# Patient Record
Sex: Female | Born: 1995 | Race: White | Hispanic: No | Marital: Single | Smoking: Never smoker
Health system: Southern US, Community
[De-identification: ages and names within clinical notes are randomized; demographics above are authoritative.]

## PROBLEM LIST (undated history)

## (undated) DIAGNOSIS — N946 Dysmenorrhea, unspecified: Secondary | ICD-10-CM

## (undated) DIAGNOSIS — H539 Unspecified visual disturbance: Secondary | ICD-10-CM

## (undated) DIAGNOSIS — K219 Gastro-esophageal reflux disease without esophagitis: Secondary | ICD-10-CM

## (undated) DIAGNOSIS — J301 Allergic rhinitis due to pollen: Secondary | ICD-10-CM

## (undated) HISTORY — DX: Unspecified visual disturbance: H53.9

## (undated) HISTORY — DX: Gastro-esophageal reflux disease without esophagitis: K21.9

## (undated) HISTORY — DX: Allergic rhinitis due to pollen: J30.1

## (undated) HISTORY — DX: Dysmenorrhea, unspecified: N94.6

## (undated) HISTORY — PX: NO PAST SURGERIES: SHX2092

---
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
## 2017-06-24 DIAGNOSIS — F3341 Major depressive disorder, recurrent, in partial remission: Secondary | ICD-10-CM | POA: Insufficient documentation

## 2017-06-24 DIAGNOSIS — J4540 Moderate persistent asthma, uncomplicated: Secondary | ICD-10-CM | POA: Insufficient documentation

## 2019-02-23 DIAGNOSIS — F988 Other specified behavioral and emotional disorders with onset usually occurring in childhood and adolescence: Secondary | ICD-10-CM | POA: Insufficient documentation

## 2019-02-23 DIAGNOSIS — Z720 Tobacco use: Secondary | ICD-10-CM | POA: Insufficient documentation

## 2019-02-23 DIAGNOSIS — F411 Generalized anxiety disorder: Secondary | ICD-10-CM | POA: Insufficient documentation

## 2020-01-09 NOTE — L&D Delivery Note (Addendum)
°  Delivery Note:   G1P0 at [redacted]w[redacted]d  Admitting diagnosis: Indication for care in labor or delivery [O75.9] Risks: Rh negative MJ use  Onset of labor: 2055 IOL/Augmentation: AROM and Pitocin ROM: 2245  Complete dilation at 12/31/2020  0056 Onset of pushing at 0104 FHR second stage 145  Analgesia /Anesthesia intrapartum:Epidural  Pushing in lithotomy position with CNM and L&D staff support, mother present for birth and supportive.  Delivery of a Live born female  Birth Weight:  3210g APGAR: 9, 9  Newborn Delivery   Birth date/time: 12/31/2020 01:44:00 Delivery type: Vaginal, Spontaneous      in cephalic presentation, position ROA. Gentle traction applied and shoulders delivered with ease. Baby delivered and placed onto mom's chest. Baby crying and mother excited.  APGAR:1 min-9 , 5 min-9   Nuchal Cord: Yes , reduced  Cord double clamped after cessation of pulsation, cut by mother.  Collection of cord blood for typing completed. Cord blood donation-None  Arterial cord blood sample-No    Placenta delivered-Spontaneous  with 3 vessels . Uterotonics: IV Pitocin Placenta to L&D Uterine tone firm  Bleeding mild  1st degree;Vaginal  laceration identified.  Episiotomy:None  Local analgesia: N/A  Repair: 3.0 suture, well approximated, no hematoma Est. Blood Loss (mL):897.00   Complications: None  Mom to postpartum.  Baby Reagan to Couplet care / Skin to Skin.  Delivery Report:   Review the Delivery Report for details.    Trellis Moment, SNM 12/31/2020, 3:59 AM   The above was performed under my direct supervision and guidance.

## 2020-05-10 LAB — OB RESULTS CONSOLE GC/CHLAMYDIA
Chlamydia: NEGATIVE
Gonorrhea: NEGATIVE

## 2020-05-10 LAB — OB RESULTS CONSOLE RPR: RPR: NONREACTIVE

## 2020-05-10 LAB — HEPATITIS C ANTIBODY: HCV Ab: NEGATIVE

## 2020-05-10 LAB — OB RESULTS CONSOLE HEPATITIS B SURFACE ANTIGEN: Hepatitis B Surface Ag: NEGATIVE

## 2020-05-10 LAB — OB RESULTS CONSOLE GBS: GBS: NEGATIVE

## 2020-05-10 LAB — OB RESULTS CONSOLE RUBELLA ANTIBODY, IGM: Rubella: IMMUNE

## 2020-05-10 LAB — OB RESULTS CONSOLE HIV ANTIBODY (ROUTINE TESTING): HIV: NONREACTIVE

## 2020-05-17 DIAGNOSIS — R87612 Low grade squamous intraepithelial lesion on cytologic smear of cervix (LGSIL): Secondary | ICD-10-CM | POA: Insufficient documentation

## 2020-12-30 ENCOUNTER — Other Ambulatory Visit: Payer: Self-pay

## 2020-12-30 ENCOUNTER — Inpatient Hospital Stay (HOSPITAL_COMMUNITY)
Admission: AD | Admit: 2020-12-30 | Discharge: 2021-01-01 | DRG: 806 | Disposition: A | Discharge status: Home / Self Care | Payer: BLUE CROSS/BLUE SHIELD | Attending: Obstetrics and Gynecology | Admitting: Obstetrics and Gynecology

## 2020-12-30 ENCOUNTER — Inpatient Hospital Stay (HOSPITAL_COMMUNITY): Payer: BLUE CROSS/BLUE SHIELD | Admitting: Anesthesiology

## 2020-12-30 ENCOUNTER — Encounter (HOSPITAL_COMMUNITY): Payer: Self-pay | Admitting: Obstetrics and Gynecology

## 2020-12-30 DIAGNOSIS — F1290 Cannabis use, unspecified, uncomplicated: Secondary | ICD-10-CM | POA: Diagnosis present

## 2020-12-30 DIAGNOSIS — O480 Post-term pregnancy: Principal | ICD-10-CM | POA: Diagnosis present

## 2020-12-30 DIAGNOSIS — O99324 Drug use complicating childbirth: Secondary | ICD-10-CM | POA: Diagnosis present

## 2020-12-30 DIAGNOSIS — O09899 Supervision of other high risk pregnancies, unspecified trimester: Secondary | ICD-10-CM

## 2020-12-30 DIAGNOSIS — O26893 Other specified pregnancy related conditions, third trimester: Secondary | ICD-10-CM | POA: Diagnosis present

## 2020-12-30 DIAGNOSIS — O4202 Full-term premature rupture of membranes, onset of labor within 24 hours of rupture: Secondary | ICD-10-CM | POA: Diagnosis not present

## 2020-12-30 DIAGNOSIS — Z20822 Contact with and (suspected) exposure to covid-19: Secondary | ICD-10-CM | POA: Diagnosis present

## 2020-12-30 DIAGNOSIS — J45909 Unspecified asthma, uncomplicated: Secondary | ICD-10-CM | POA: Diagnosis present

## 2020-12-30 DIAGNOSIS — Z3A40 40 weeks gestation of pregnancy: Secondary | ICD-10-CM | POA: Diagnosis not present

## 2020-12-30 DIAGNOSIS — O9952 Diseases of the respiratory system complicating childbirth: Secondary | ICD-10-CM | POA: Diagnosis present

## 2020-12-30 DIAGNOSIS — Z2839 Other underimmunization status: Secondary | ICD-10-CM

## 2020-12-30 DIAGNOSIS — Z6791 Unspecified blood type, Rh negative: Secondary | ICD-10-CM | POA: Diagnosis not present

## 2020-12-30 LAB — POCT FERN TEST: POCT Fern Test: POSITIVE

## 2020-12-30 LAB — CBC
HCT: 37.6 % (ref 36.0–46.0)
Hemoglobin: 12.5 g/dL (ref 12.0–15.0)
MCH: 31.6 pg (ref 26.0–34.0)
MCHC: 33.2 g/dL (ref 30.0–36.0)
MCV: 95.2 fL (ref 80.0–100.0)
Platelets: 176 10*3/uL (ref 150–400)
RBC: 3.95 MIL/uL (ref 3.87–5.11)
RDW: 13.6 % (ref 11.5–15.5)
WBC: 8 10*3/uL (ref 4.0–10.5)
nRBC: 0 % (ref 0.0–0.2)

## 2020-12-30 LAB — RESP PANEL BY RT-PCR (FLU A&B, COVID) ARPGX2
Influenza A by PCR: NEGATIVE
Influenza B by PCR: NEGATIVE
SARS Coronavirus 2 by RT PCR: NEGATIVE

## 2020-12-30 LAB — TYPE AND SCREEN
ABO/RH(D): O NEG
Antibody Screen: POSITIVE

## 2020-12-30 MED ORDER — ACETAMINOPHEN 325 MG PO TABS: 650.0000 mg | ORAL_TABLET | ORAL | Status: DC | PRN

## 2020-12-30 MED ORDER — OXYTOCIN BOLUS FROM INFUSION
333.0000 mL | Freq: Once | INTRAVENOUS | Status: AC
  Administered 2020-12-31: 02:00:00 333 mL via INTRAVENOUS

## 2020-12-30 MED ORDER — OXYTOCIN-SODIUM CHLORIDE 30-0.9 UT/500ML-% IV SOLN
1.0000 m[IU]/min | INTRAVENOUS | Status: DC
  Administered 2020-12-30: 23:00:00 4 m[IU]/min via INTRAVENOUS
  Administered 2020-12-30: 23:00:00 2 m[IU]/min via INTRAVENOUS

## 2020-12-30 MED ORDER — FENTANYL CITRATE (PF) 100 MCG/2ML IJ SOLN: 50.0000 ug | INTRAMUSCULAR | Status: DC | PRN

## 2020-12-30 MED ORDER — ONDANSETRON HCL 4 MG/2ML IJ SOLN: 4.0000 mg | Freq: Four times a day (QID) | INTRAMUSCULAR | Status: DC | PRN

## 2020-12-30 MED ORDER — LIDOCAINE HCL (PF) 1 % IJ SOLN
INTRAMUSCULAR | Status: DC | PRN
  Administered 2020-12-30: 8 mL via EPIDURAL

## 2020-12-30 MED ORDER — OXYTOCIN-SODIUM CHLORIDE 30-0.9 UT/500ML-% IV SOLN
2.5000 [IU]/h | INTRAVENOUS | Status: DC
  Administered 2020-12-31: 02:00:00 2.5 [IU]/h via INTRAVENOUS
  Filled 2020-12-30: qty 500

## 2020-12-30 MED ORDER — LACTATED RINGERS IV SOLN: INTRAVENOUS | Status: DC

## 2020-12-30 MED ORDER — LIDOCAINE HCL (PF) 1 % IJ SOLN: 30.0000 mL | INTRAMUSCULAR | Status: DC | PRN

## 2020-12-30 MED ORDER — DIPHENHYDRAMINE HCL 50 MG/ML IJ SOLN: 12.5000 mg | INTRAMUSCULAR | Status: DC | PRN

## 2020-12-30 MED ORDER — TERBUTALINE SULFATE 1 MG/ML IJ SOLN: 0.2500 mg | Freq: Once | INTRAMUSCULAR | Status: DC | PRN

## 2020-12-30 MED ORDER — LACTATED RINGERS IV SOLN
500.0000 mL | INTRAVENOUS | Status: DC | PRN
  Administered 2020-12-31: 03:00:00 500 mL via INTRAVENOUS

## 2020-12-30 MED ORDER — LACTATED RINGERS IV SOLN
500.0000 mL | Freq: Once | INTRAVENOUS | Status: AC
  Administered 2020-12-30: 18:00:00 500 mL via INTRAVENOUS

## 2020-12-30 MED ORDER — EPHEDRINE 5 MG/ML INJ: 10.0000 mg | INTRAVENOUS | Status: DC | PRN

## 2020-12-30 MED ORDER — FENTANYL-BUPIVACAINE-NACL 0.5-0.125-0.9 MG/250ML-% EP SOLN
12.0000 mL/h | EPIDURAL | Status: DC | PRN
  Administered 2020-12-30: 17:00:00 12 mL/h via EPIDURAL
  Filled 2020-12-30: qty 250

## 2020-12-30 MED ORDER — FENTANYL-BUPIVACAINE-NACL 0.5-0.125-0.9 MG/250ML-% EP SOLN: 12.0000 mL/h | EPIDURAL | Status: DC | PRN

## 2020-12-30 MED ORDER — OXYCODONE-ACETAMINOPHEN 5-325 MG PO TABS: 2.0000 | ORAL_TABLET | ORAL | Status: DC | PRN

## 2020-12-30 MED ORDER — PHENYLEPHRINE 40 MCG/ML (10ML) SYRINGE FOR IV PUSH (FOR BLOOD PRESSURE SUPPORT)
80.0000 ug | PREFILLED_SYRINGE | INTRAVENOUS | Status: DC | PRN
  Administered 2020-12-31: 04:00:00 80 ug via INTRAVENOUS

## 2020-12-30 MED ORDER — PHENYLEPHRINE 40 MCG/ML (10ML) SYRINGE FOR IV PUSH (FOR BLOOD PRESSURE SUPPORT)
80.0000 ug | PREFILLED_SYRINGE | INTRAVENOUS | Status: DC | PRN
  Filled 2020-12-30: qty 10

## 2020-12-30 MED ORDER — SOD CITRATE-CITRIC ACID 500-334 MG/5ML PO SOLN
30.0000 mL | ORAL | Status: DC | PRN
  Administered 2020-12-30: 21:00:00 30 mL via ORAL
  Filled 2020-12-30: qty 30

## 2020-12-30 MED ORDER — OXYCODONE-ACETAMINOPHEN 5-325 MG PO TABS: 1.0000 | ORAL_TABLET | ORAL | Status: DC | PRN

## 2020-12-30 NOTE — Anesthesia Procedure Notes (Signed)
Epidural Patient location during procedure: OB Start time: 12/30/2020 5:14 PM End time: 12/30/2020 5:12 PM  Staffing Anesthesiologist: Bethena Midget, MD  Preanesthetic Checklist Completed: patient identified, IV checked, site marked, risks and benefits discussed, surgical consent, monitors and equipment checked, pre-op evaluation and timeout performed  Epidural Patient position: sitting Prep: DuraPrep and site prepped and draped Patient monitoring: continuous pulse ox and blood pressure Approach: midline Location: L4-L5 Injection technique: LOR air  Needle:  Needle type: Tuohy  Needle gauge: 17 G Needle length: 9 cm and 9 Needle insertion depth: 7 cm Catheter type: closed end flexible Catheter size: 19 Gauge Catheter at skin depth: 12 cm Test dose: negative  Assessment Events: blood not aspirated, injection not painful, no injection resistance, no paresthesia and negative IV test

## 2020-12-30 NOTE — MAU Note (Signed)
...  Tonya Montes is a 25 y.o. at [redacted]w[redacted]d here in MAU reporting: CTX every 8 minutes since yesterday evening and LOF since midnight. She states she was seen in L&D Triage in [facility] yesterday evening and was sent home for false labor. She states she was supposed to be an induction today but that she was told multiple times last night that they were short staffed and would most likely not be able to be brought in the next day. She states she received a call from a nurse this morning that once again stated they were busy and short staffed and that if she was having CTX to call her OB and schedule an appointment to be seen. She states she has also had light brown vaginal bleeding.  She states around midnight she had a trickle of fluids mixed with blood run down her leg and she has had a couple of episodes of leaking since this occurred.   DFM since this morning. Feeling movement just states it is less than normal.  OB/GYN in [place]. Last [facility] visit yesterday.  Pain score:  6/10 lower back - with contractions  FHT: 150 initial external

## 2020-12-30 NOTE — Anesthesia Preprocedure Evaluation (Signed)
Anesthesia Evaluation  Patient identified by MRN, date of birth, ID band Patient awake    Reviewed: Allergy & Precautions, H&P , NPO status , Patient's Chart, lab work & pertinent test results, reviewed documented beta blocker date and time   Airway Mallampati: II  TM Distance: >3 FB Neck ROM: full    Dental no notable dental hx. (+) Teeth Intact, Dental Advisory Given   Pulmonary asthma ,    Pulmonary exam normal breath sounds clear to auscultation       Cardiovascular negative cardio ROS Normal cardiovascular exam Rhythm:regular Rate:Normal     Neuro/Psych negative neurological ROS  negative psych ROS   GI/Hepatic negative GI ROS, Neg liver ROS,   Endo/Other  negative endocrine ROS  Renal/GU negative Renal ROS  negative genitourinary   Musculoskeletal   Abdominal   Peds  Hematology negative hematology ROS (+)   Anesthesia Other Findings   Reproductive/Obstetrics (+) Pregnancy                             Anesthesia Physical Anesthesia Plan  ASA: 2  Anesthesia Plan: Epidural   Post-op Pain Management:    Induction:   PONV Risk Score and Plan: 2 and Ondansetron and Treatment may vary due to age or medical condition  Airway Management Planned: Natural Airway  Additional Equipment: None  Intra-op Plan:   Post-operative Plan:   Informed Consent: I have reviewed the patients History and Physical, chart, labs and discussed the procedure including the risks, benefits and alternatives for the proposed anesthesia with the patient or authorized representative who has indicated his/her understanding and acceptance.       Plan Discussed with: Anesthesiologist  Anesthesia Plan Comments:         Anesthesia Quick Evaluation
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
## 2020-12-30 NOTE — Progress Notes (Signed)
S: Patient overall comfortable with epidural. Mother at bedside. No concerns at this time. Anticipating the arrival of abby girl Reagan.   O: Vitals:   12/30/20 1945 12/30/20 2100 12/30/20 2130 12/30/20 2200  BP: 116/65 114/78 109/61 (!) 104/53  Pulse: 85 (!) 167 87 89  Resp: 18 18 18 18   Temp:      TempSrc:      SpO2:      Weight:      Height:        FHT:  FHR: 125 bpm, variability: moderate,  accelerations:  Present,  decelerations:  Absent UC:   irregular, every 2-6 minutes SVE:   Dilation: 6.5 Effacement (%): 80 Station: -1 Exam by:: [email or web address], SNM  A / P: 25 y.o. G1P0 [redacted]w[redacted]d SROM at 0000, protracted active labor  AROM 2245 Pitocin for labor augmentation due to unchanged cervix GBS negative  Fetal Wellbeing:  Category I Pain Control:  Epidural Anticipated MOD:  NSVD  [redacted]w[redacted]d, SNM 12/30/2020, 10:49 PM

## 2020-12-31 ENCOUNTER — Encounter (HOSPITAL_COMMUNITY): Payer: Self-pay | Admitting: Obstetrics and Gynecology

## 2020-12-31 DIAGNOSIS — O4202 Full-term premature rupture of membranes, onset of labor within 24 hours of rupture: Secondary | ICD-10-CM

## 2020-12-31 DIAGNOSIS — O99324 Drug use complicating childbirth: Secondary | ICD-10-CM

## 2020-12-31 DIAGNOSIS — O480 Post-term pregnancy: Secondary | ICD-10-CM

## 2020-12-31 DIAGNOSIS — Z3A40 40 weeks gestation of pregnancy: Secondary | ICD-10-CM

## 2020-12-31 LAB — RPR: RPR Ser Ql: NONREACTIVE

## 2020-12-31 MED ORDER — ACETAMINOPHEN 325 MG PO TABS
650.0000 mg | ORAL_TABLET | ORAL | Status: DC | PRN
  Administered 2020-12-31 (×2): 650 mg via ORAL
  Filled 2020-12-31 (×2): qty 2

## 2020-12-31 MED ORDER — ONDANSETRON HCL 4 MG/2ML IJ SOLN: 4.0000 mg | INTRAMUSCULAR | Status: DC | PRN

## 2020-12-31 MED ORDER — ONDANSETRON HCL 4 MG PO TABS: 4.0000 mg | ORAL_TABLET | ORAL | Status: DC | PRN

## 2020-12-31 MED ORDER — COCONUT OIL OIL: 1.0000 "application " | TOPICAL_OIL | Status: DC | PRN

## 2020-12-31 MED ORDER — SENNOSIDES-DOCUSATE SODIUM 8.6-50 MG PO TABS
2.0000 | ORAL_TABLET | Freq: Every day | ORAL | Status: DC
  Administered 2021-01-01: 10:00:00 2 via ORAL
  Filled 2020-12-31: qty 2

## 2020-12-31 MED ORDER — WITCH HAZEL-GLYCERIN EX PADS: 1.0000 "application " | MEDICATED_PAD | CUTANEOUS | Status: DC | PRN

## 2020-12-31 MED ORDER — RHO D IMMUNE GLOBULIN 1500 UNIT/2ML IJ SOSY
300.0000 ug | PREFILLED_SYRINGE | Freq: Once | INTRAMUSCULAR | Status: AC
  Administered 2020-12-31: 12:00:00 300 ug via INTRAVENOUS
  Filled 2020-12-31: qty 2

## 2020-12-31 MED ORDER — TETANUS-DIPHTH-ACELL PERTUSSIS 5-2.5-18.5 LF-MCG/0.5 IM SUSY: 0.5000 mL | PREFILLED_SYRINGE | Freq: Once | INTRAMUSCULAR | Status: DC

## 2020-12-31 MED ORDER — DIPHENHYDRAMINE HCL 25 MG PO CAPS: 25.0000 mg | ORAL_CAPSULE | Freq: Four times a day (QID) | ORAL | Status: DC | PRN

## 2020-12-31 MED ORDER — DIBUCAINE (PERIANAL) 1 % EX OINT: 1.0000 "application " | TOPICAL_OINTMENT | CUTANEOUS | Status: DC | PRN

## 2020-12-31 MED ORDER — IBUPROFEN 600 MG PO TABS
600.0000 mg | ORAL_TABLET | Freq: Four times a day (QID) | ORAL | Status: DC
  Administered 2020-12-31 – 2021-01-01 (×5): 600 mg via ORAL
  Filled 2020-12-31 (×7): qty 1

## 2020-12-31 MED ORDER — OXYCODONE HCL 5 MG PO TABS: 5.0000 mg | ORAL_TABLET | ORAL | Status: DC | PRN

## 2020-12-31 MED ORDER — PRENATAL MULTIVITAMIN CH
1.0000 | ORAL_TABLET | Freq: Every day | ORAL | Status: DC
  Administered 2020-12-31 – 2021-01-01 (×2): 1 via ORAL
  Filled 2020-12-31 (×2): qty 1

## 2020-12-31 MED ORDER — ZOLPIDEM TARTRATE 5 MG PO TABS: 5.0000 mg | ORAL_TABLET | Freq: Every evening | ORAL | Status: DC | PRN

## 2020-12-31 MED ORDER — BENZOCAINE-MENTHOL 20-0.5 % EX AERO
1.0000 "application " | INHALATION_SPRAY | CUTANEOUS | Status: DC | PRN
  Administered 2020-12-31: 1 via TOPICAL
  Filled 2020-12-31: qty 56

## 2020-12-31 MED ORDER — SIMETHICONE 80 MG PO CHEW: 80.0000 mg | CHEWABLE_TABLET | ORAL | Status: DC | PRN

## 2020-12-31 NOTE — Discharge Summary (Signed)
Postpartum Discharge Summary  Date of Service updated     Patient Name: Tonya Montes DOB: 1995-02-23 MRN: [id number]  Date of admission: 12/30/2020 Delivery date:12/31/2020  Delivering provider: WHITE, Ubaldo Glassing D  Date of discharge: 01/01/2021  Admitting diagnosis: Indication for care in labor or delivery [O75.9] Intrauterine pregnancy: [redacted]w[redacted]d    Secondary diagnosis:  Principal Problem:   Indication for care in labor or delivery Active Problems:   Rubella non-immune status, antepartum  Additional problems: Rh neg    Discharge diagnosis: Term Pregnancy Delivered                                              Post partum procedures: RhoGAM  Augmentation: Pitocin Complications: None  Hospital course: Onset of Labor With Vaginal Delivery      25y.o. yo G1P0 at 412w5das admitted in Active Labor on 12/30/2020. Patient had an uncomplicated labor course as follows:  Membrane Rupture Time/Date: 12:00 AM ,12/30/2020   Delivery Method:Vaginal, Spontaneous  Episiotomy: None  Lacerations:  1st degree;Vaginal  Patient had an uncomplicated postpartum course.  She is ambulating, tolerating a regular diet, passing flatus, and urinating well. Patient is discharged home in stable condition on 01/01/21.  Newborn Data: Birth date:12/31/2020  Birth time:1:44 AM  Gender:Female  Living status:Living  Apgars:9 ,9  Weight:3210 g   Magnesium Sulfate received: No BMZ received: No Rhophylac:Yes MMR: offered T-DaP:Given prenatally Flu: No Transfusion:No  Physical exam  Vitals:   12/31/20 0920 12/31/20 1320 12/31/20 2112 01/01/21 0522  BP: 104/65 (!) 98/58 121/70 (!) 100/58  Pulse: 88 82 77 81  Resp: '18 18 18 18  ' Temp: 98 F (36.7 C) 99 F (37.2 C) 98.4 F (36.9 C) 98.1 F (36.7 C)  TempSrc: Oral Oral Oral Oral  SpO2:   100% 100%  Weight:      Height:       General: alert, cooperative, and no distress Lochia: appropriate Uterine Fundus: firm Incision: N/A DVT  Evaluation: No significant calf/ankle edema. Labs: Lab Results  Component Value Date   WBC 8.0 12/30/2020   HGB 12.5 12/30/2020   HCT 37.6 12/30/2020   MCV 95.2 12/30/2020   PLT 176 12/30/2020   No flowsheet data found. Edinburgh Score: Edinburgh Postnatal Depression Scale Screening Tool 01/01/2021  I have been able to laugh and see the funny side of things. 0  I have looked forward with enjoyment to things. 0  I have blamed myself unnecessarily when things went wrong. 1  I have been anxious or worried for no good reason. 1  I have felt scared or panicky for no good reason. 0  Things have been getting on top of me. 0  I have been so unhappy that I have had difficulty sleeping. 0  I have felt sad or miserable. 0  I have been so unhappy that I have been crying. 0  The thought of harming myself has occurred to me. 0  Edinburgh Postnatal Depression Scale Total 2     After visit meds:  Allergies as of 01/01/2021   No Known Allergies      Medication List     TAKE these medications    acetaminophen 500 MG tablet Commonly known as: TYLENOL Take 500-1,000 mg by mouth every 6 (six) hours as needed for mild pain or headache.   albuterol 108 (90  Base) MCG/ACT inhaler Commonly known as: VENTOLIN HFA Inhale 2 puffs into the lungs every 4 (four) hours as needed (for cough, chest tightness). What changed: reasons to take this   famotidine 20 MG tablet Commonly known as: PEPCID Take 20 mg by mouth daily.   ibuprofen 600 MG tablet Commonly known as: ADVIL Take 1 tablet (600 mg total) by mouth every 6 (six) hours as needed.   multivitamin-prenatal 27-0.8 MG Tabs tablet Take 1 tablet by mouth daily at 12 noon.         Discharge home in stable condition Infant Feeding: Bottle Infant Disposition:home with mother Discharge instruction: per After Visit Summary and Postpartum booklet. Activity: Advance as tolerated. Pelvic rest for 6 weeks.  Diet: routine diet Future  Appointments:No future appointments. Follow up Visit:  Instructed to call [facility] for postpartum care as well.  Please schedule this patient for a In person postpartum visit in 4 weeks with the following provider: Any provider. Additional Postpartum F/U:  Low risk pregnancy complicated by:  Delivery mode:  Vaginal, Spontaneous  Anticipated Birth Control:   declined   01/01/2021 Patriciaann Clan, DO

## 2020-12-31 NOTE — Clinical Social Work Maternal (Signed)
CLINICAL SOCIAL WORK MATERNAL/CHILD NOTE  Patient Details  Name: Tonya Montes MRN: [id number] Date of Birth: October 05, 1995  Date:  12/31/2020  Clinical Social Worker Initiating Note:  Darcus Austin, MSW, LCSWA Date/Time: Initiated:  12/31/20/1640     Child's Name:  Tonya Montes   Biological Parents:  Mother   Need for Interpreter:  None   Reason for Referral:  Behavioral Health Concerns, Current Substance Use/Substance Use During Pregnancy     Address: [address], [place] [address]  Janalyn Rouse [facility] Address:   [place] [address]    Phone number:  [phone] (home)     Additional phone number: [phone] [place] Members/Support Persons (HM/SP):   Household Member/Support Person 1   HM/SP Name Relationship DOB or Age  HM/SP -[place]    HM/SP -2        HM/SP -3        HM/SP -4        HM/SP -5        HM/SP -6        HM/SP -7        HM/SP -8          Natural Supports (not living in the home):  Immediate Family, Extended Family   Professional Supports: None   Employment: Part-time   Type of Work: Tour manager:  [place] arranged:    Pensions consultant:  Multimedia programmer    Other Resources:  [employer], Physicist, medical     Cultural/Religious Considerations Which May Impact Care:    Strengths:  Ability to meet basic needs  , Engineer, materials, Home prepared for child     Psychotropic Medications:         Pediatrician:    Careers adviser area  Pediatrician List:   [facility] ([address])  [place]      Pediatrician Fax Number:    Risk Factors/Current Problems:  Substance Use  , Mental Health Concerns     Cognitive State:  Able to Concentrate  , Alert  , Goal Oriented  , Insightful  , Linear Thinking     Mood/Affect:  Comfortable  ,  Interested  , Calm  , Happy  , Bright  , Relaxed     CSW Assessment: CSW met with MOB to complete consult for mental health, and substance use during pregnancy. CSW observed MOB sitting in recliner, and infant in bassinet. CSW explained role, and reason for consult. MOB was pleasant, and polite during engagement with CSW. MOB reported, history of ADD, anxiety, and major depressive disorder. MOB reported, history of the following symptoms: crying, not wanting to get out of bed, over/under eating, irritation, insomnia, over thinking, and racing thoughts. MOB reported, history of Prozac, and her last dose was three years ago. MOB reported, she has been able to manage symptoms without medication. CSW encourage MOB to implement healthy coping skills when symptoms arises.   MOB reported, history of THC, and her last use was at first month of pregnancy. MOB reported, the duration of her THC use was occasionally for sickness. MOB denied any additional illicit substances, and CPS involvement. CSW inform MOB of drug screen policy, and MOB was understanding of protocol. CSW will continue to follow the CDS, and will make  CPS report if warranted.   CSW provided education regarding the baby blues period vs. perinatal mood disorders, discussed treatment and gave resources for mental health follow up if concerns arise. CSW recommends self- evaluation during the postpartum time period using the New Mom Checklist from Postpartum Progress and encouraged MOB to contact a medical professional if symptoms are noted at any time.   MOB reported, since delivery she feels, "good". MOB reported, her family is very supportive. MOB denied SI, HI, and DV when CSW assessed for safety.   MOB reported, she receives [facility], and food stamps. MOB reported, infant's pediatrician will be at [facility]. MOB reported, there are no transportation barriers to follow up infant's care. MOB reported, she has all essentials needed  to care for infant. MOB reported, infant has a car seat, and bassinet. MOB denied any additional barriers.     CSW provided education on sudden infant death syndrome (SIDS).  CSW provided perinatal mood disorders resources.   CSW will continue to follow the CDS, and will make CPS report if warranted.   CSW Plan/Description:  No Further Intervention Required/No Barriers to Discharge, Sudden Infant Death Syndrome (SIDS) Education, Perinatal Mood and Anxiety Disorder (PMADs) Education, Other Information/Referral to [facility], CSW Will Continue to Monitor Umbilical Cord Tissue Drug Screen Results and Make Report if Warranted   Darcus Austin, MSW, LCSW-A Clinical Social Worker- Weekends [phone]    Darcus Austin, Latanya Presser 12/31/2020, 4:44 PM

## 2021-01-01 DIAGNOSIS — Z2839 Encounter for supervision of normal pregnancy, unspecified, unspecified trimester: Secondary | ICD-10-CM

## 2021-01-01 DIAGNOSIS — O09899 Supervision of other high risk pregnancies, unspecified trimester: Secondary | ICD-10-CM

## 2021-01-01 HISTORY — DX: Supervision of other high risk pregnancies, unspecified trimester: O09.899

## 2021-01-01 HISTORY — DX: Encounter for supervision of normal pregnancy, unspecified, unspecified trimester: Z28.39

## 2021-01-01 LAB — RH IG WORKUP (INCLUDES ABO/RH)
Fetal Screen: NEGATIVE
Gestational Age(Wks): 40
Unit division: 0

## 2021-01-01 MED ORDER — IBUPROFEN 600 MG PO TABS
600.0000 mg | ORAL_TABLET | Freq: Four times a day (QID) | ORAL | 0 refills | Status: DC | PRN
Start: 2021-01-01 — End: 2021-04-25

## 2021-01-01 MED ORDER — MEASLES, MUMPS & RUBELLA VAC IJ SOLR: 0.5000 mL | Freq: Once | INTRAMUSCULAR | Status: DC

## 2021-01-01 NOTE — Anesthesia Postprocedure Evaluation (Signed)
Anesthesia Post Note  Patient: Tonya Montes  Procedure(s) Performed: AN AD HOC LABOR EPIDURAL     Patient location during evaluation: PACU Anesthesia Type: Epidural Level of consciousness: awake and alert and oriented Pain management: pain level controlled Vital Signs Assessment: post-procedure vital signs reviewed and stable Respiratory status: spontaneous breathing, nonlabored ventilation and respiratory function stable Cardiovascular status: blood pressure returned to baseline and stable Postop Assessment: no headache, no backache and able to ambulate Anesthetic complications: no   No notable events documented.  Last Vitals:  Vitals:   12/31/20 2112 01/01/21 0522  BP: 121/70 (!) 100/58  Pulse: 77 81  Resp: 18 18  Temp: 36.9 C 36.7 C  SpO2: 100% 100%    Last Pain:  Vitals:   01/01/21 0810  TempSrc:   PainSc: 0-No pain   Pain Goal:                   [name]

## 2021-01-12 ENCOUNTER — Telehealth (HOSPITAL_COMMUNITY): Payer: Self-pay

## 2021-01-12 NOTE — Telephone Encounter (Signed)
"  I'm good. Low on energy, resting when baby rests. Eating well and drinking plenty of fluids. I think I'm healing pretty well." Patient has no questions or concerns about her healing.  "She's really good. She eats well, 3-4 ounces a feeding. She burps well, no vomiting, no spitting up. She seems comfortable and satisfied. She has a pediatrician appointment on the 8th. I think she has been having a little bit of constipation." RN told patient about doing bicycle legs and wiping the baby's bottom with a warm washcloth to aide with having a bowel movement. RN told patient to call her pediatrician if she has concerns about constipation. "She sleeps in a bedside bassinet." RN reviewed ABC's of safe sleep with patient. Patient declines any questions or concerns about baby.  EPDS score is 7.   Marcelino Duster [facility] 01/12/2021,1646

## 2021-02-01 ENCOUNTER — Ambulatory Visit: Payer: BLUE CROSS/BLUE SHIELD | Admitting: Obstetrics and Gynecology

## 2021-04-17 ENCOUNTER — Telehealth: Payer: Self-pay

## 2021-04-17 NOTE — Telephone Encounter (Signed)
Pt states that she has hemorrhoids and they have gotten really bad.  They have become very painful in the past three days.   ? ?Dianna Deshler,RN  ?04/17/21 ? ?

## 2021-04-18 NOTE — Telephone Encounter (Signed)
Called pt. Pt reports using preparation H suppository, pain and itch cream, and Tuck's pads. Currently feeling BM is hard to pass. Recommended Miralax for stool softener. Will follow up at appt next week with Vergie Living, MD. ?

## 2021-04-25 ENCOUNTER — Inpatient Hospital Stay (HOSPITAL_COMMUNITY): Admit: 2021-04-25 | Payer: BLUE CROSS/BLUE SHIELD

## 2021-04-25 ENCOUNTER — Encounter: Payer: Self-pay | Admitting: Obstetrics and Gynecology

## 2021-04-25 ENCOUNTER — Ambulatory Visit (INDEPENDENT_AMBULATORY_CARE_PROVIDER_SITE_OTHER): Payer: BLUE CROSS/BLUE SHIELD | Admitting: Obstetrics and Gynecology

## 2021-04-25 VITALS — BP 109/72 | HR 69 | Wt 194.4 lb

## 2021-04-25 DIAGNOSIS — Z01419 Encounter for gynecological examination (general) (routine) without abnormal findings: Secondary | ICD-10-CM | POA: Diagnosis not present

## 2021-04-25 DIAGNOSIS — K921 Melena: Secondary | ICD-10-CM

## 2021-04-25 DIAGNOSIS — R300 Dysuria: Secondary | ICD-10-CM

## 2021-04-25 DIAGNOSIS — N939 Abnormal uterine and vaginal bleeding, unspecified: Secondary | ICD-10-CM

## 2021-04-25 DIAGNOSIS — K649 Unspecified hemorrhoids: Secondary | ICD-10-CM

## 2021-04-25 DIAGNOSIS — R87612 Low grade squamous intraepithelial lesion on cytologic smear of cervix (LGSIL): Secondary | ICD-10-CM

## 2021-04-25 NOTE — Progress Notes (Signed)
?Obstetrics and Gynecology ?New Patient Evaluation ? ?Appointment Date: 04/25/2021 ? ?OBGYN Clinic: [facility] ? ?Primary Care Provider: Pcp, No ? ?Referring Provider: No ref. provider found ? ?Chief Complaint:  ?Chief Complaint  ?Patient presents with  ? Gynecologic Exam  ? ? ?History of Present Illness: Tonya Montes is a 26 y.o. Caucasian G1P1001 (Patient's last menstrual period was 04/10/2021 (approximate).), seen for the above chief complaint. Her past medical history is significant for 2022 LSIL pap, anxiety/depression. ? ?Patient had normal spontaneous vaginal delivery in December 2022 with a just a 1st degree repaired with 3-0 suture.  ? ?She missed her January postpartum visit.  ? ?She states that since delivery she's had issues with hemorrhoids and discomfort with BMs and even noted some blood in her BM; she did not have hemorrhoids or any issue with this pre pregnancy. She doesn't feel constipated and goes at least daily and without straining. She has tried all OTC medications but no help ? ?She also notes discomfort every time she voids. It's not painful or like dysuria, per se, but it just feels uncomfortable and this started post delivery, as well. No hematuria, smell to urine.  ? ?She has been abstinent since delivery and she never breastfed and she had her first period last month but it came on three times; one time for about 5 days and then 3 days the other times and they were all somewhat heavy. Last bleeding two weeks ago. She had regular, qmonth periods prior to pregnancy. She has not been on any medications for birth control since delivery.  ? ? ?Review of Systems: Pertinent items noted in HPI and remainder of comprehensive ROS otherwise negative.  ? ?Patient Active Problem List  ? Diagnosis Date Noted  ? LGSIL on Pap smear of cervix 05/17/2020  ? Tobacco abuse disorder 02/23/2019  ? Generalized anxiety disorder 02/23/2019  ? ADD (attention  deficit disorder) without hyperactivity 02/23/2019  ? Recurrent major depressive disorder, in partial remission (HCC) 06/24/2017  ? Moderate persistent asthma without complication 06/24/2017  ? ? ?Past Medical History:  ?Past Medical History:  ?Diagnosis Date  ? Allergic rhinitis due to pollen   ? grass  ? Asthma   ? triggers -- winter colds, exercise  ? Dysmenorrhea   ? GERD (gastroesophageal reflux disease)   ? Rubella non-immune status, antepartum 01/01/2021  ? Vision abnormalities   ? myopic astigmatism  ? ? ?Past Surgical History:  ?Past Surgical History:  ?Procedure Laterality Date  ? NO PAST SURGERIES    ? ? ?Past Obstetrical History:  ?OB History  ?Gravida Para Term Preterm AB Living  ?1 1 1     1   ?SAB IAB Ectopic Multiple Live Births  ?      0 1  ?  ?# Outcome Date GA Lbr Len/2nd Weight Sex Delivery Anes PTL Lv  ?1 Term 12/31/20 [redacted]w[redacted]d 33:56 / 00:48 7 lb 1.2 oz (3.21 kg) F Vag-Spont EPI  LIV  ?   Birth Comments: wnl  ? ? ?Past Gynecological History: As per HPI. ? ? ?Social History:  ?Social History  ? ?Socioeconomic History  ? Marital status: Single  ?  Spouse name: Not on file  ? Number of children: Not on file  ? Years of education: Not on file  ? Highest education level: Not on file  ?Occupational History  ? Not on file  ?Tobacco Use  ? Smoking status: Never  ?  Passive exposure: Yes  ?  Smokeless tobacco: Not on file  ?Vaping Use  ? Vaping Use: Never used  ?Substance and Sexual Activity  ? Alcohol use: No  ?  Alcohol/week: 0.0 standard drinks  ? Drug use: No  ? Sexual activity: Not Currently  ?Other Topics Concern  ? Not on file  ?Social History Narrative  ? Not on file  ? ?Social Determinants of Health  ? ?Financial Resource Strain: Not on file  ?Food Insecurity: Food Insecurity Present  ? Worried About Programme researcher, broadcasting/film/videounning Out of Food in the Last Year: Sometimes true  ? Ran Out of Food in the Last Year: Sometimes true  ?Transportation Needs: No Transportation Needs  ? Lack of Transportation (Medical): No  ? Lack of  Transportation (Non-Medical): No  ?Physical Activity: Not on file  ?Stress: Not on file  ?Social Connections: Not on file  ?Intimate Partner Violence: Not on file  ? ? ?Family History:  ?Family History  ?Problem Relation Age of Onset  ? Arthritis Mother   ? Asthma Mother   ? Heart disease Mother   ?     vascular disease  ? Miscarriages / [place]birth[place] Mother   ? Vision loss Mother   ?     cataract  ? Asthma Brother   ? Cancer Maternal Grandmother   ? Arthritis Maternal Grandmother   ? Varicose Veins Maternal Grandmother   ? Cancer Maternal Grandfather   ? Arthritis Maternal Grandfather   ? Alcohol abuse Neg Hx   ? Birth defects Neg Hx   ? COPD Neg Hx   ? Depression Neg Hx   ? Diabetes Neg Hx   ? Drug abuse Neg Hx   ? Early death Neg Hx   ? Hearing loss Neg Hx   ? Hyperlipidemia Neg Hx   ? Hypertension Neg Hx   ? Kidney disease Neg Hx   ? Learning disabilities Neg Hx   ? Mental illness Neg Hx   ? Mental retardation Neg Hx   ? Stroke Neg Hx   ? Asthma Brother   ? ? ? ?Medications ?Tonya Montes had no medications administered during this visit. ?Current Outpatient Medications  ?Medication Sig Dispense Refill  ? albuterol (PROVENTIL HFA;VENTOLIN HFA) 108 (90 BASE) MCG/ACT inhaler Inhale 2 puffs into the lungs every 4 (four) hours as needed (for cough, chest tightness). (Patient taking differently: Inhale 2 puffs into the lungs every 4 (four) hours as needed for wheezing or shortness of breath.) 1 Inhaler 6  ? ?No current facility-administered medications for this visit.  ? ? ?Allergies ?Patient has no known allergies. ? ? ?Physical Exam:  ?BP 109/72   Pulse 69   Wt 194 lb 6.4 oz (88.2 kg)   LMP 04/10/2021 (Approximate)   Breastfeeding No   BMI 29.56 kg/m?  Body mass index is 29.56 kg/m?. ?General appearance: Well nourished, well developed female in no acute distress.  ?Neck:  Supple, normal appearance, and no thyromegaly  ?Cardiovascular: normal s1 and s2.  No murmurs, rubs or gallops. ?Respiratory:   Clear to auscultation bilateral. Normal respiratory effort ?Abdomen: positive bowel sounds and no masses, hernias; diffusely non tender to palpation, non distended ?Neuro/Psych:  Normal mood and affect.  ?Skin:  Warm and dry.  ?Lymphatic:  No inguinal lymphadenopathy.  ? ?Pelvic exam: is not limited by body habitus ?EGBUS: within normal limits ?Vagina: within normal limits and with no blood or discharge in the vault ?Cervix: normal appearing cervix without tenderness, discharge or lesions. ?Uterus:  nonenlarged and non tender ?Adnexa:  normal adnexa and no mass, fullness, tenderness ?Rectovaginal: deferred ?Small 86mm external hemorrhoid noted, normal appearing, nttp ? ?Laboratory: UPT negative ? ?Radiology: none ? ?Assessment: pt stable ? ?Plan:  ?1. Dysuria ?May benefit from pelvic floor PT. F/u Ucx first ?- Urine Culture ? ?2. Hemorrhoids, unspecified hemorrhoid type ?- Ambulatory referral to Gastroenterology ? ?3. Abnormal uterine bleeding (AUB) ?A little for menses to resume but I told her the first few periods can be abnormal before going back to how they were pre-pregnancy. Recommend expectant management for now and to keep track of them for the next few months to make sure they go back to normal ?- Cervicovaginal ancillary only( [place]) ? ?4. LGSIL on Pap smear of cervix ?- Cytology - PAP( [place]) ?- Cervicovaginal ancillary only( [place]) ? ?5. Melena ?- Ambulatory referral to Gastroenterology ? ? ?RTC PRN ? ?Cornelia Copa MD ?Attending ?[facility] [employer])  ?

## 2021-04-26 LAB — CERVICOVAGINAL ANCILLARY ONLY
Bacterial Vaginitis (gardnerella): NEGATIVE
Candida Glabrata: NEGATIVE
Candida Vaginitis: NEGATIVE
Chlamydia: NEGATIVE
Comment: NEGATIVE
Comment: NEGATIVE
Comment: NEGATIVE
Comment: NEGATIVE
Comment: NEGATIVE
Comment: NORMAL
Neisseria Gonorrhea: NEGATIVE
Trichomonas: NEGATIVE

## 2021-04-26 LAB — URINE CULTURE

## 2021-04-26 LAB — POCT PREGNANCY, URINE: Preg Test, Ur: NEGATIVE

## 2021-04-27 LAB — CYTOLOGY - PAP
Diagnosis: NEGATIVE
Diagnosis: REACTIVE

## 2021-07-19 ENCOUNTER — Ambulatory Visit: Payer: Medicaid Other | Admitting: Internal Medicine
